# Patient Record
Sex: Female | Born: 1995 | Race: White | Hispanic: No | Marital: Single | Smoking: Never smoker
Health system: Southern US, Community
[De-identification: ages and names within clinical notes are randomized; demographics above are authoritative.]

## PROBLEM LIST (undated history)

## (undated) DIAGNOSIS — F909 Attention-deficit hyperactivity disorder, unspecified type: Secondary | ICD-10-CM

---

## 2018-09-01 ENCOUNTER — Encounter: Payer: Self-pay | Admitting: Emergency Medicine

## 2018-09-01 ENCOUNTER — Other Ambulatory Visit: Payer: Self-pay

## 2018-09-01 ENCOUNTER — Emergency Department
Admission: EM | Admit: 2018-09-01 | Discharge: 2018-09-01 | Disposition: A | Discharge status: Home / Self Care | Payer: 59 | Attending: Emergency Medicine | Admitting: Emergency Medicine

## 2018-09-01 ENCOUNTER — Emergency Department: Payer: 59

## 2018-09-01 DIAGNOSIS — Y929 Unspecified place or not applicable: Secondary | ICD-10-CM | POA: Insufficient documentation

## 2018-09-01 DIAGNOSIS — R52 Pain, unspecified: Secondary | ICD-10-CM

## 2018-09-01 DIAGNOSIS — Y939 Activity, unspecified: Secondary | ICD-10-CM | POA: Diagnosis not present

## 2018-09-01 DIAGNOSIS — F10929 Alcohol use, unspecified with intoxication, unspecified: Secondary | ICD-10-CM | POA: Insufficient documentation

## 2018-09-01 DIAGNOSIS — Y999 Unspecified external cause status: Secondary | ICD-10-CM | POA: Insufficient documentation

## 2018-09-01 DIAGNOSIS — Z79899 Other long term (current) drug therapy: Secondary | ICD-10-CM | POA: Diagnosis not present

## 2018-09-01 DIAGNOSIS — S76912A Strain of unspecified muscles, fascia and tendons at thigh level, left thigh, initial encounter: Secondary | ICD-10-CM | POA: Diagnosis not present

## 2018-09-01 DIAGNOSIS — X58XXXA Exposure to other specified factors, initial encounter: Secondary | ICD-10-CM | POA: Insufficient documentation

## 2018-09-01 DIAGNOSIS — M79605 Pain in left leg: Secondary | ICD-10-CM | POA: Diagnosis present

## 2018-09-01 DIAGNOSIS — T148XXA Other injury of unspecified body region, initial encounter: Secondary | ICD-10-CM

## 2018-09-01 HISTORY — DX: Attention-deficit hyperactivity disorder, unspecified type: F90.9

## 2018-09-01 MED ORDER — DICLOFENAC SODIUM 1 % TD GEL: 4.0000 g | Freq: Four times a day (QID) | TRANSDERMAL | 0 refills | Status: AC | PRN
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
## 2018-09-01 NOTE — Discharge Instructions (Signed)
It was a pleasure to take care of you today, and thank you for coming to our emergency department.  If you have any questions or concerns before leaving please ask the nurse to grab me and I'm more than happy to go through your aftercare instructions again.  If you have any concerns once you are home that you are not improving or are in fact getting worse before you can make it to your follow-up appointment, please do not hesitate to call [phone] and come back for further evaluation.  Merrily Brittle, MD  No results found for this or any previous visit. Dg Tibia/fibula Left  Result Date: 09/01/2018 CLINICAL DATA:  Pain and swelling throughout the left leg with bruising to the left shin area. Swelling in the left foot. All areas tender to palpation. EXAM: LEFT FEMUR 2 VIEWS; LEFT TIBIA AND FIBULA - 2 VIEW; LEFT ANKLE - 2 VIEW COMPARISON:  None. FINDINGS: Four views of the left femur, two views of the left ankle, and four views of the left tib-fib are obtained. No evidence of acute fracture or dislocation involving the left femur, left tibia, fibula, or the left ankle. Joint spaces are preserved in the left hip, left knee, and left ankle. No focal bone lesion or bone destruction is identified. Bone cortex appears intact. Soft tissues are unremarkable. IMPRESSION: No acute bony abnormalities. Electronically Signed   By: Burman Nieves M.D.   On: 09/01/2018 02:02   Dg Ankle 2 Views Left  Result Date: 09/01/2018 CLINICAL DATA:  Pain and swelling throughout the left leg with bruising to the left shin area. Swelling in the left foot. All areas tender to palpation. EXAM: LEFT FEMUR 2 VIEWS; LEFT TIBIA AND FIBULA - 2 VIEW; LEFT ANKLE - 2 VIEW COMPARISON:  None. FINDINGS: Four views of the left femur, two views of the left ankle, and four views of the left tib-fib are obtained. No evidence of acute fracture or dislocation involving the left femur, left tibia, fibula, or the left ankle. Joint spaces are preserved in  the left hip, left knee, and left ankle. No focal bone lesion or bone destruction is identified. Bone cortex appears intact. Soft tissues are unremarkable. IMPRESSION: No acute bony abnormalities. Electronically Signed   By: Burman Nieves M.D.   On: 09/01/2018 02:02   Dg Femur Min 2 Views Left  Result Date: 09/01/2018 CLINICAL DATA:  Pain and swelling throughout the left leg with bruising to the left shin area. Swelling in the left foot. All areas tender to palpation. EXAM: LEFT FEMUR 2 VIEWS; LEFT TIBIA AND FIBULA - 2 VIEW; LEFT ANKLE - 2 VIEW COMPARISON:  None. FINDINGS: Four views of the left femur, two views of the left ankle, and four views of the left tib-fib are obtained. No evidence of acute fracture or dislocation involving the left femur, left tibia, fibula, or the left ankle. Joint spaces are preserved in the left hip, left knee, and left ankle. No focal bone lesion or bone destruction is identified. Bone cortex appears intact. Soft tissues are unremarkable. IMPRESSION: No acute bony abnormalities. Electronically Signed   By: Burman Nieves M.D.   On: 09/01/2018 02:02
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
## 2019-11-29 IMAGING — CR DG TIBIA/FIBULA 2V*L*
1 series · 4 of 4 positions shown · non-contrast
Comparison: None.

CLINICAL DATA: Pain and swelling throughout the left leg with
bruising to the left shin area. Swelling in the left foot. All areas
tender to palpation.

EXAM:
LEFT FEMUR 2 VIEWS; LEFT TIBIA AND FIBULA - 2 VIEW; LEFT ANKLE - 2
VIEW

[Series 1: dg tibia/fibula left · 0.14mm/px · 4 of 4 slices shown]
[im 1/4]
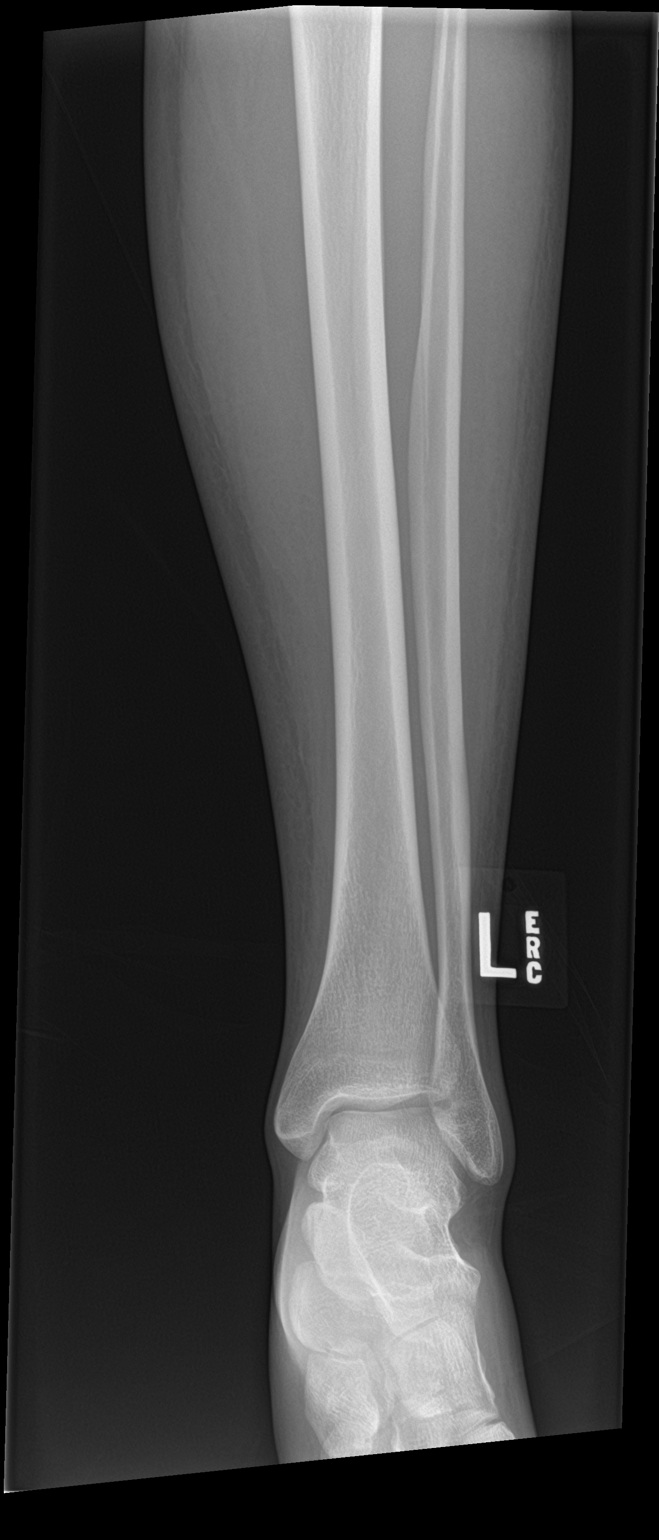
[im 2/4]
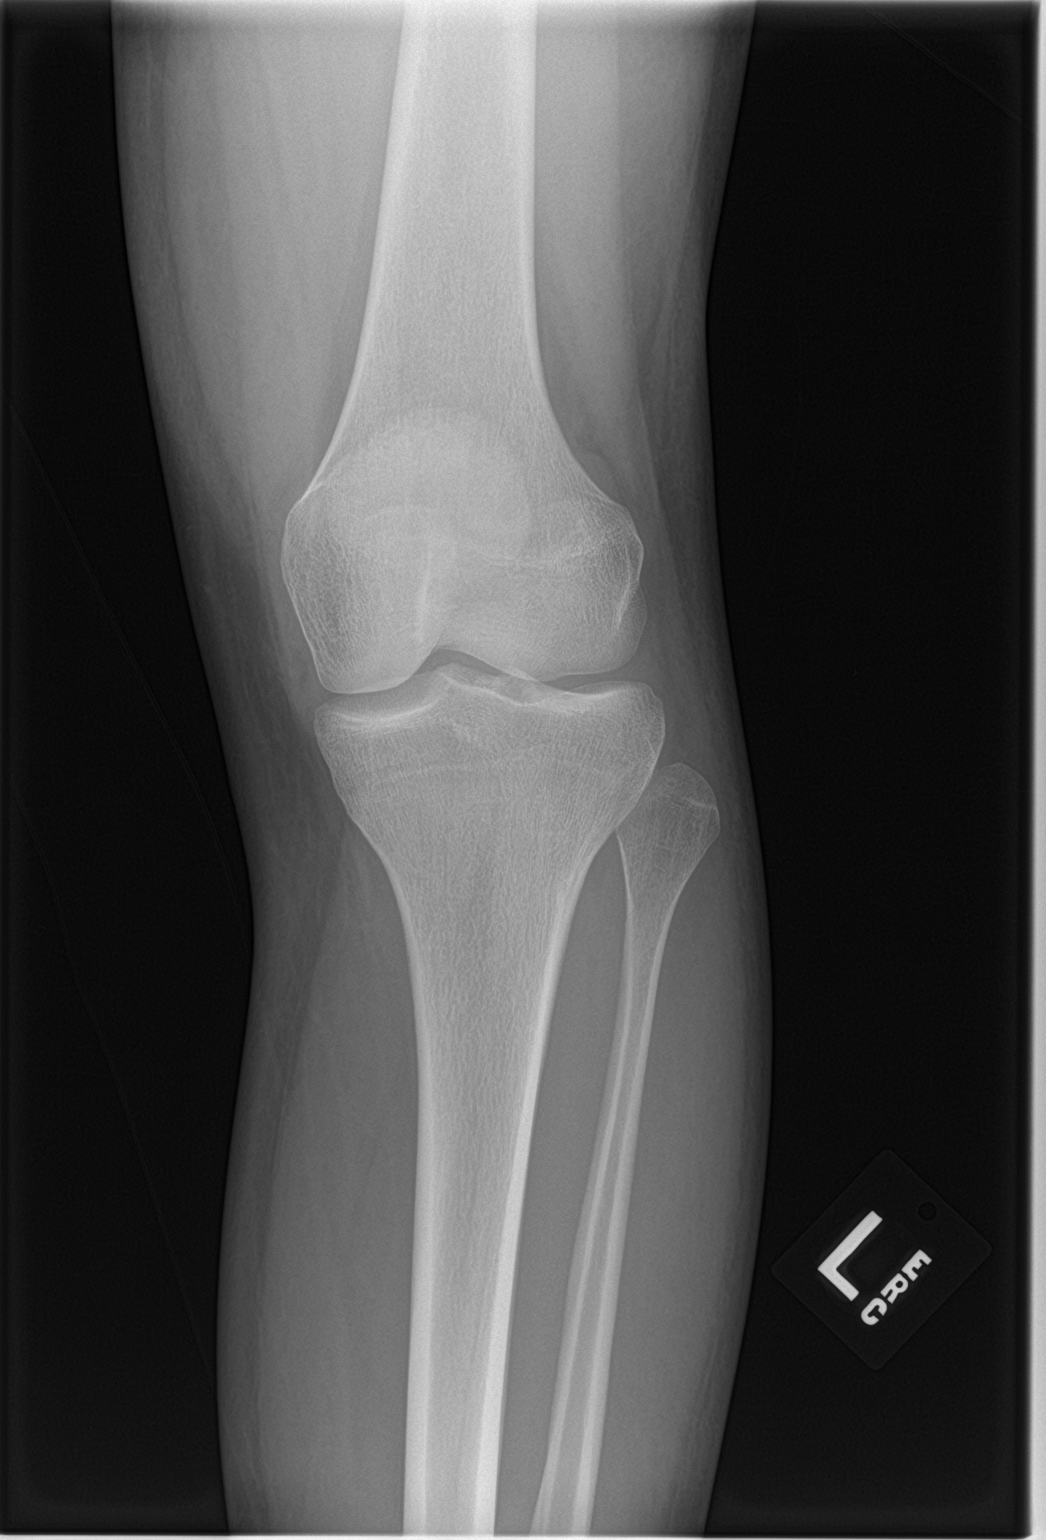
[im 3/4]
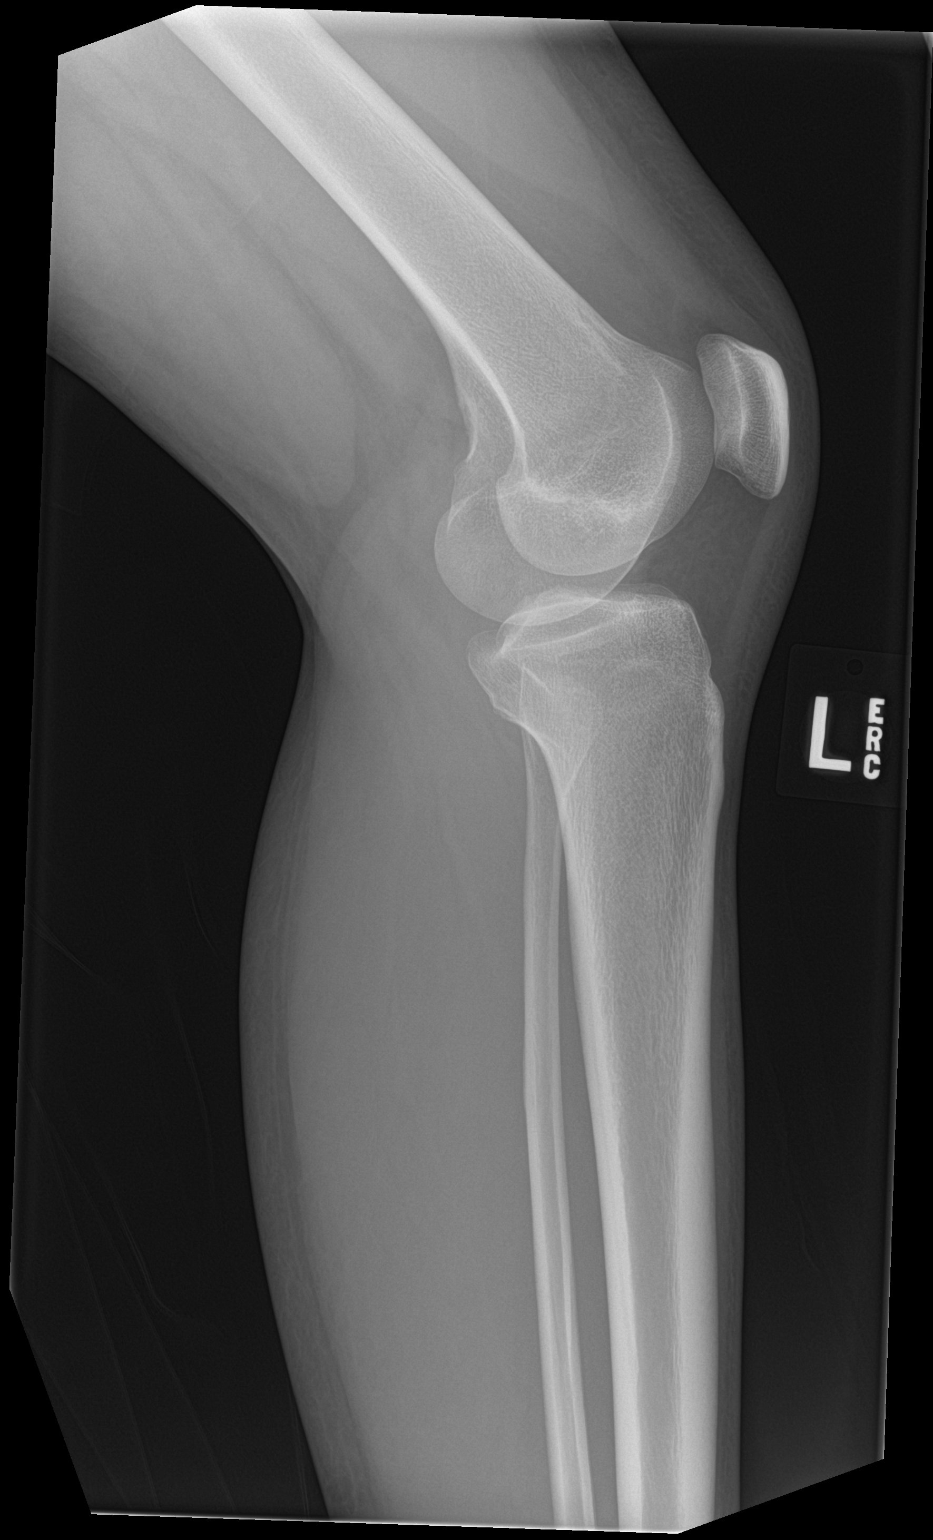
[im 4/4]
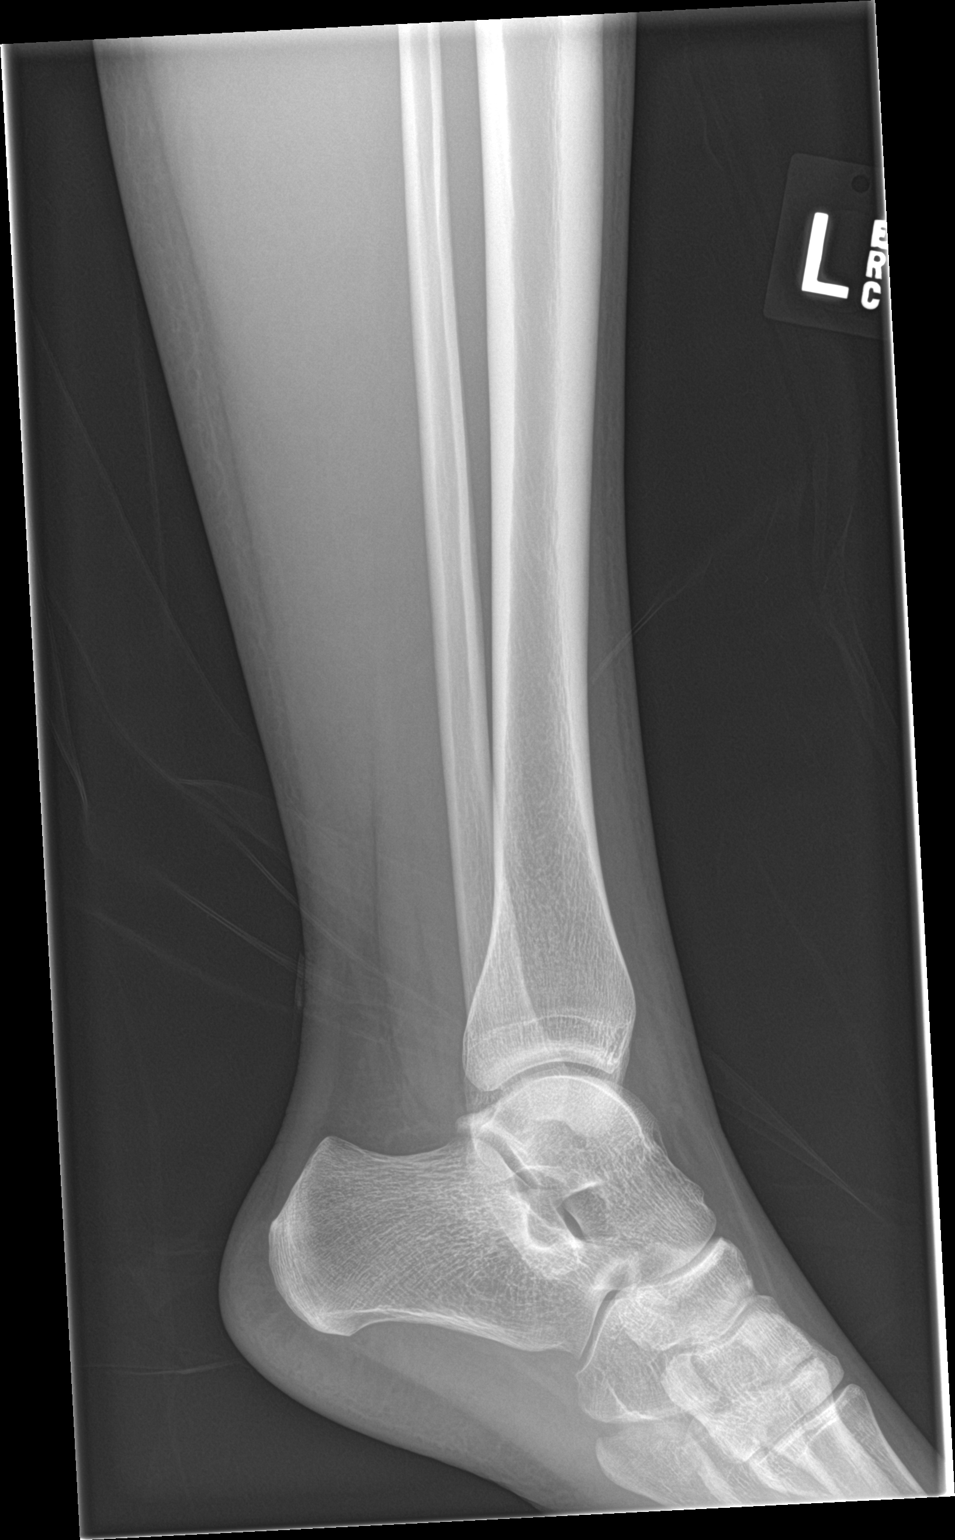

[4 of 4 positions shown; findings below may reference images not displayed]

FINDINGS: Four views of the left femur, two views of the left ankle, and four
views of the left tib-fib are obtained.

No evidence of acute fracture or dislocation involving the left
femur, left tibia, fibula, or the left ankle. Joint spaces are
preserved in the left hip, left knee, and left ankle. No focal bone
lesion or bone destruction is identified. Bone cortex appears
intact. Soft tissues are unremarkable.
IMPRESSION: No acute bony abnormalities.

## 2019-11-29 IMAGING — CR DG ANKLE 2V *L*
1 series · 2 of 2 positions shown · non-contrast
Comparison: None.

CLINICAL DATA: Pain and swelling throughout the left leg with
bruising to the left shin area. Swelling in the left foot. All areas
tender to palpation.

EXAM:
LEFT FEMUR 2 VIEWS; LEFT TIBIA AND FIBULA - 2 VIEW; LEFT ANKLE - 2
VIEW

[Series 1: dg ankle 2 views left · 0.14mm/px · 2 of 2 slices shown]
[im 1/2]
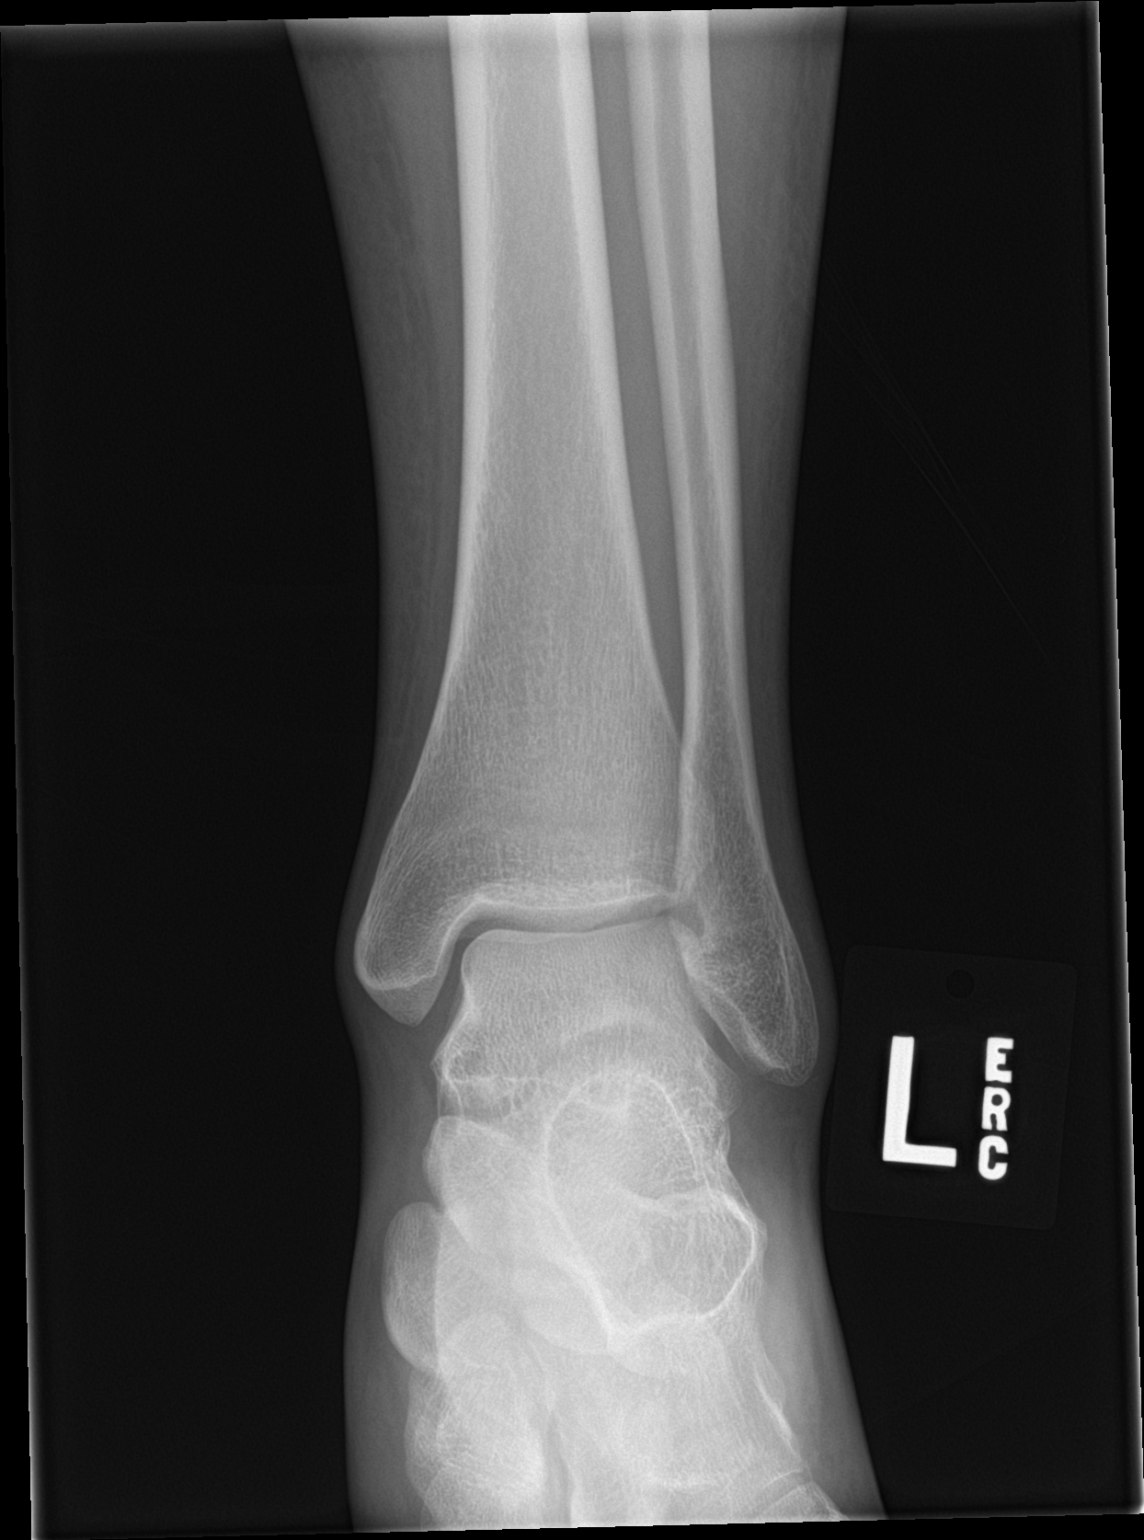
[im 2/2]
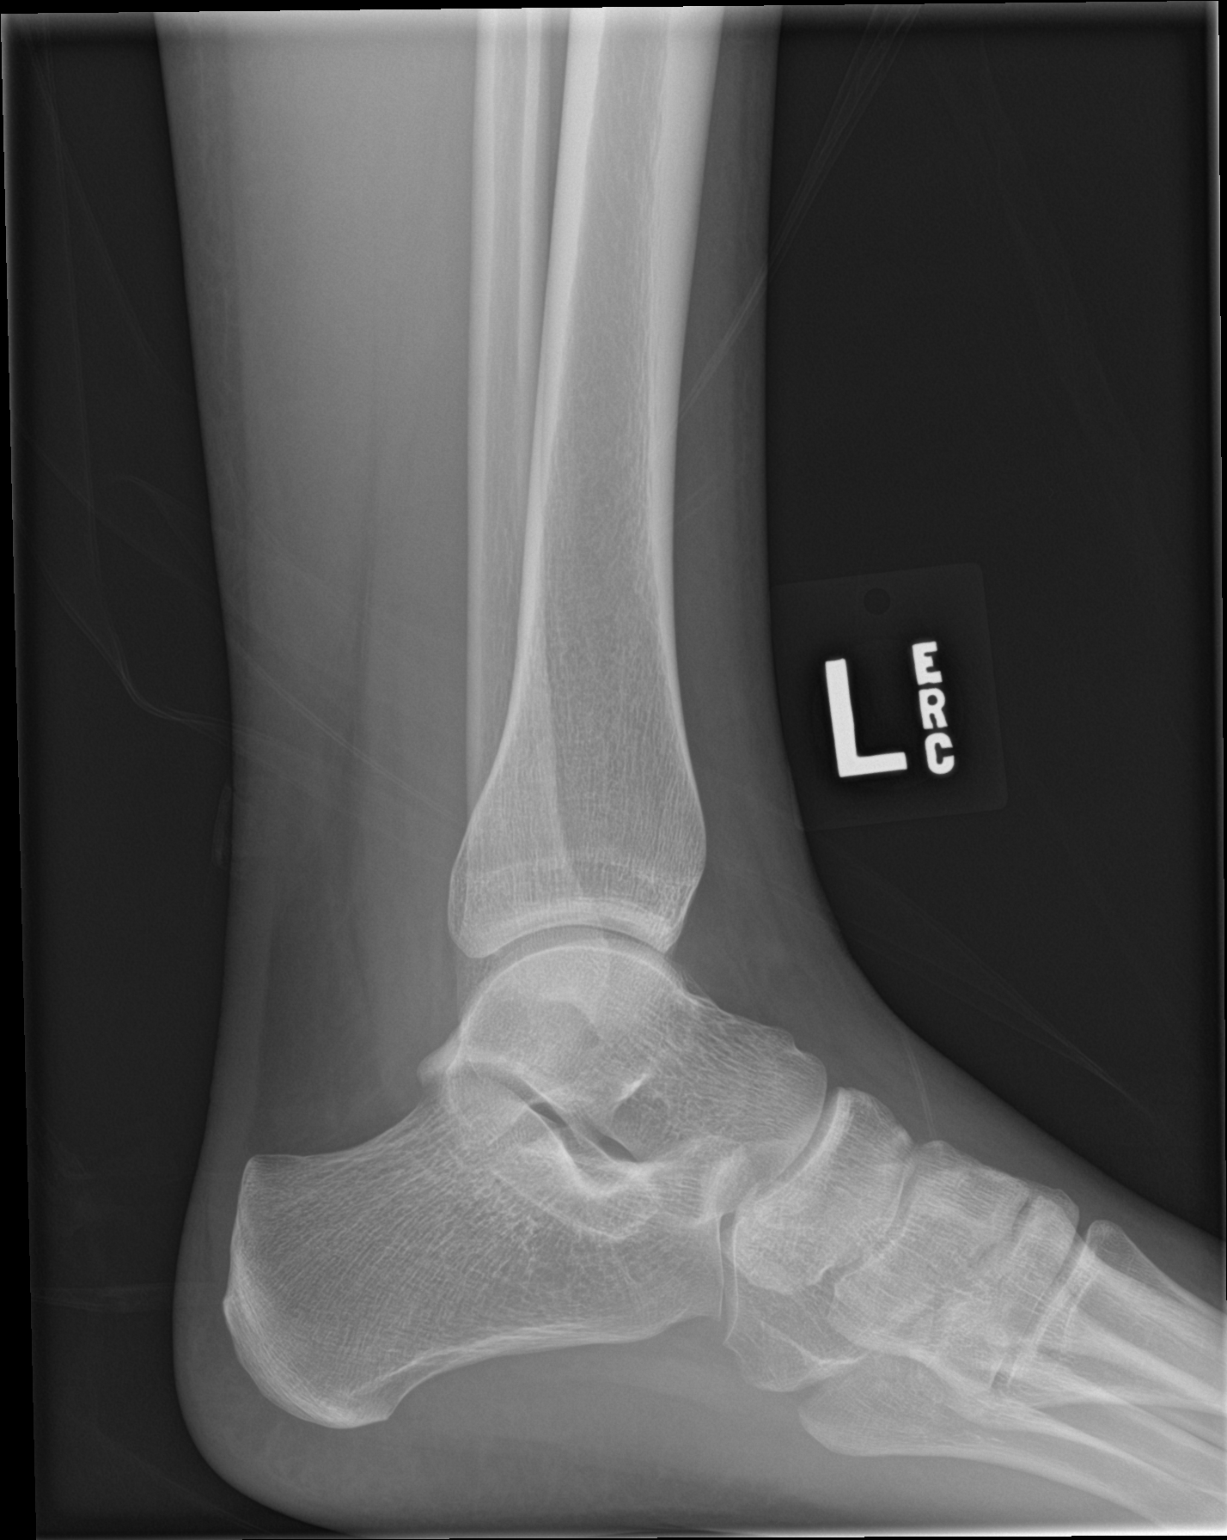

[2 of 2 positions shown; findings below may reference images not displayed]

FINDINGS: Four views of the left femur, two views of the left ankle, and four
views of the left tib-fib are obtained.

No evidence of acute fracture or dislocation involving the left
femur, left tibia, fibula, or the left ankle. Joint spaces are
preserved in the left hip, left knee, and left ankle. No focal bone
lesion or bone destruction is identified. Bone cortex appears
intact. Soft tissues are unremarkable.
IMPRESSION: No acute bony abnormalities.

## 2019-11-29 IMAGING — CR DG FEMUR 2+V*L*
1 series · 4 of 4 positions shown · non-contrast
Comparison: None.

CLINICAL DATA: Pain and swelling throughout the left leg with
bruising to the left shin area. Swelling in the left foot. All areas
tender to palpation.

EXAM:
LEFT FEMUR 2 VIEWS; LEFT TIBIA AND FIBULA - 2 VIEW; LEFT ANKLE - 2
VIEW

[Series 1: dg femur min 2 views left · 0.14mm/px · 4 of 4 slices shown]
[im 1/4]
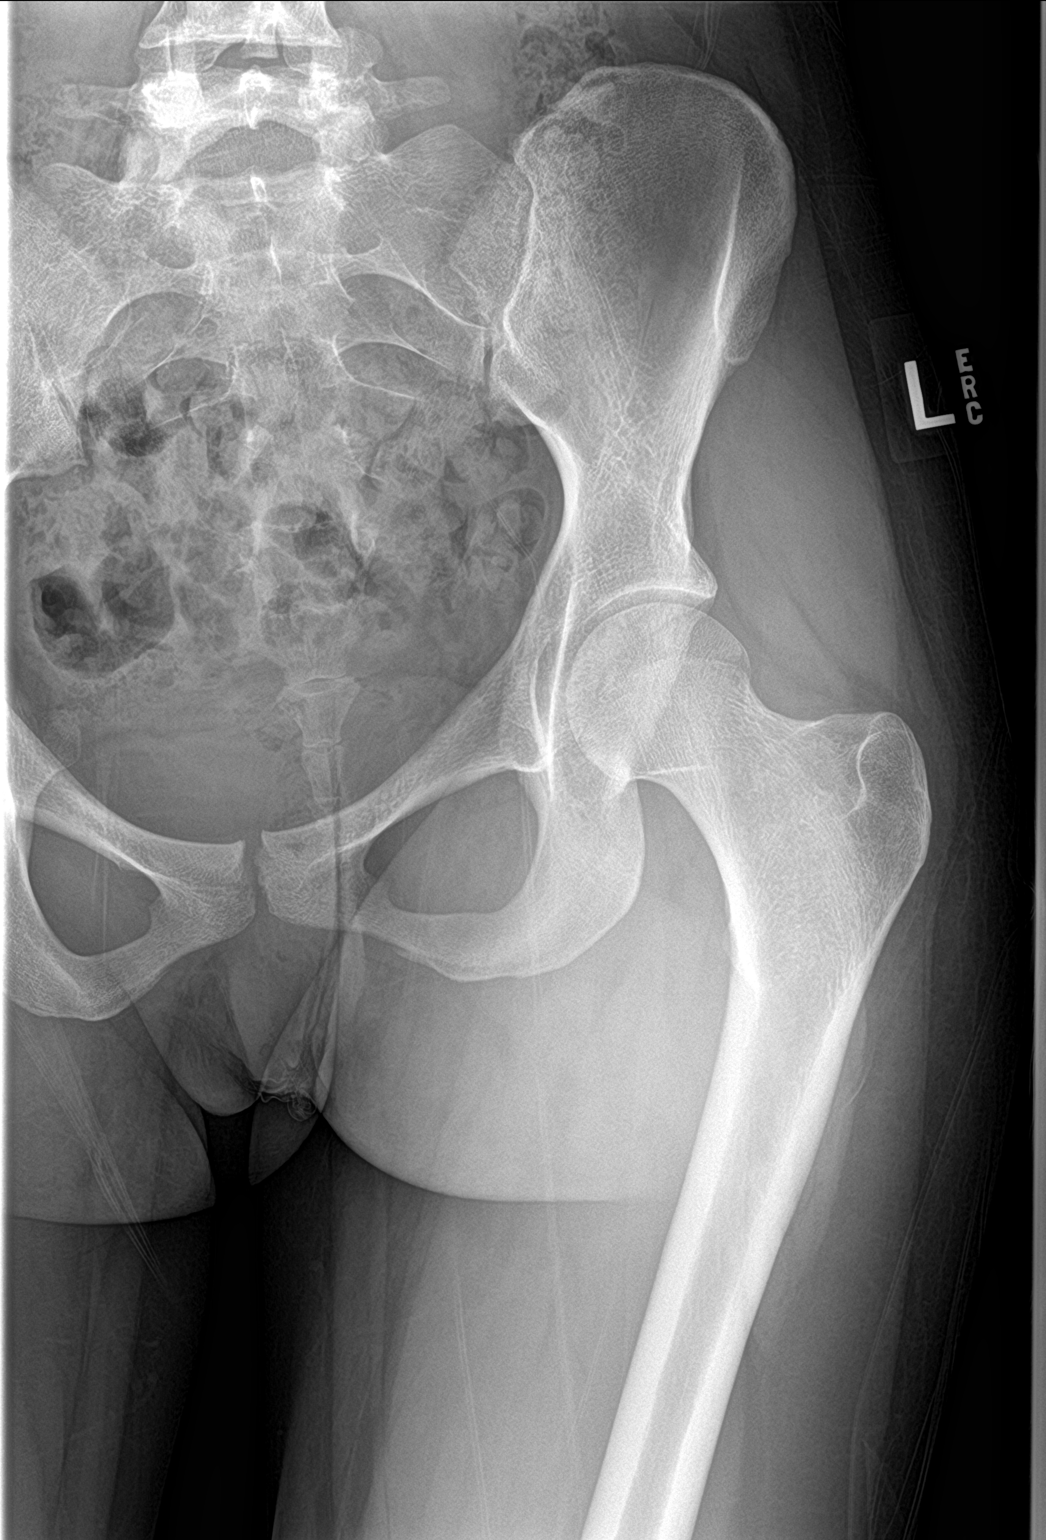
[im 2/4]
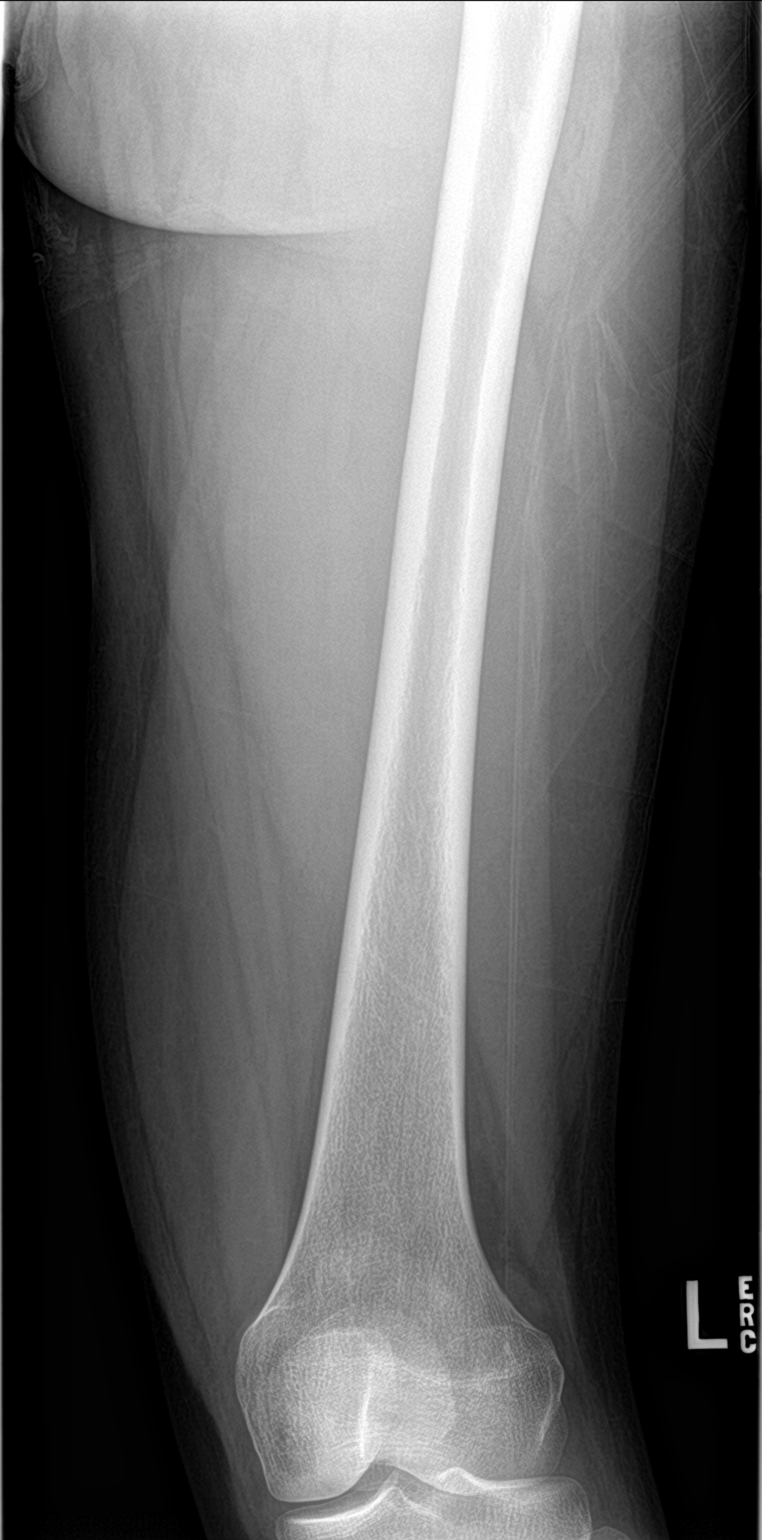
[im 3/4]
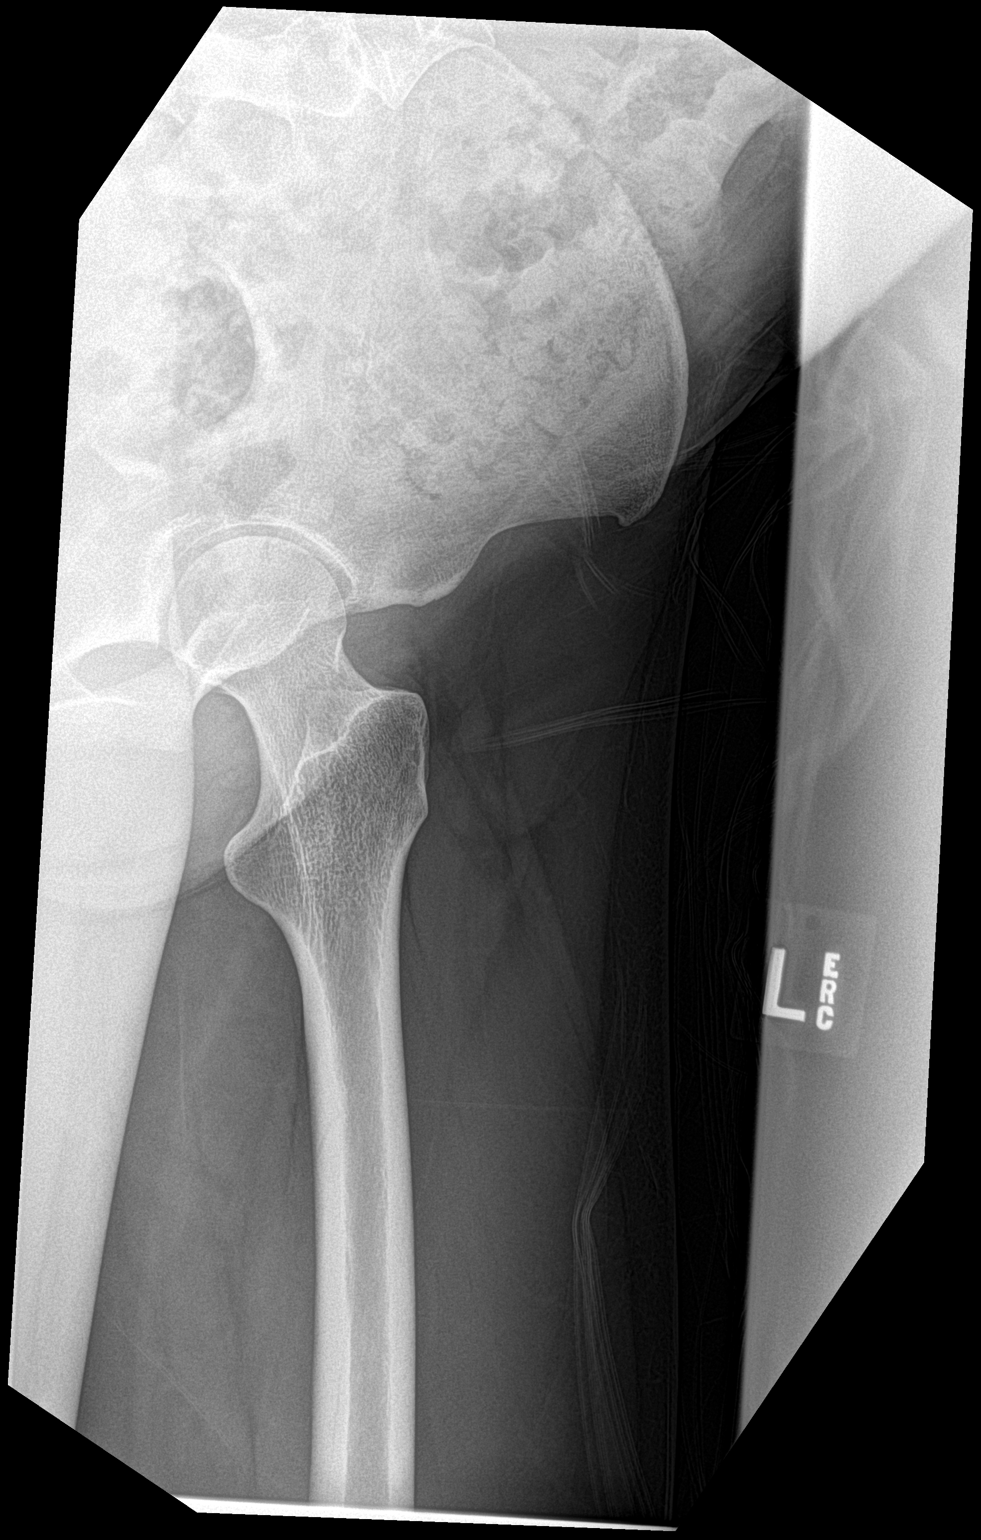
[im 4/4]
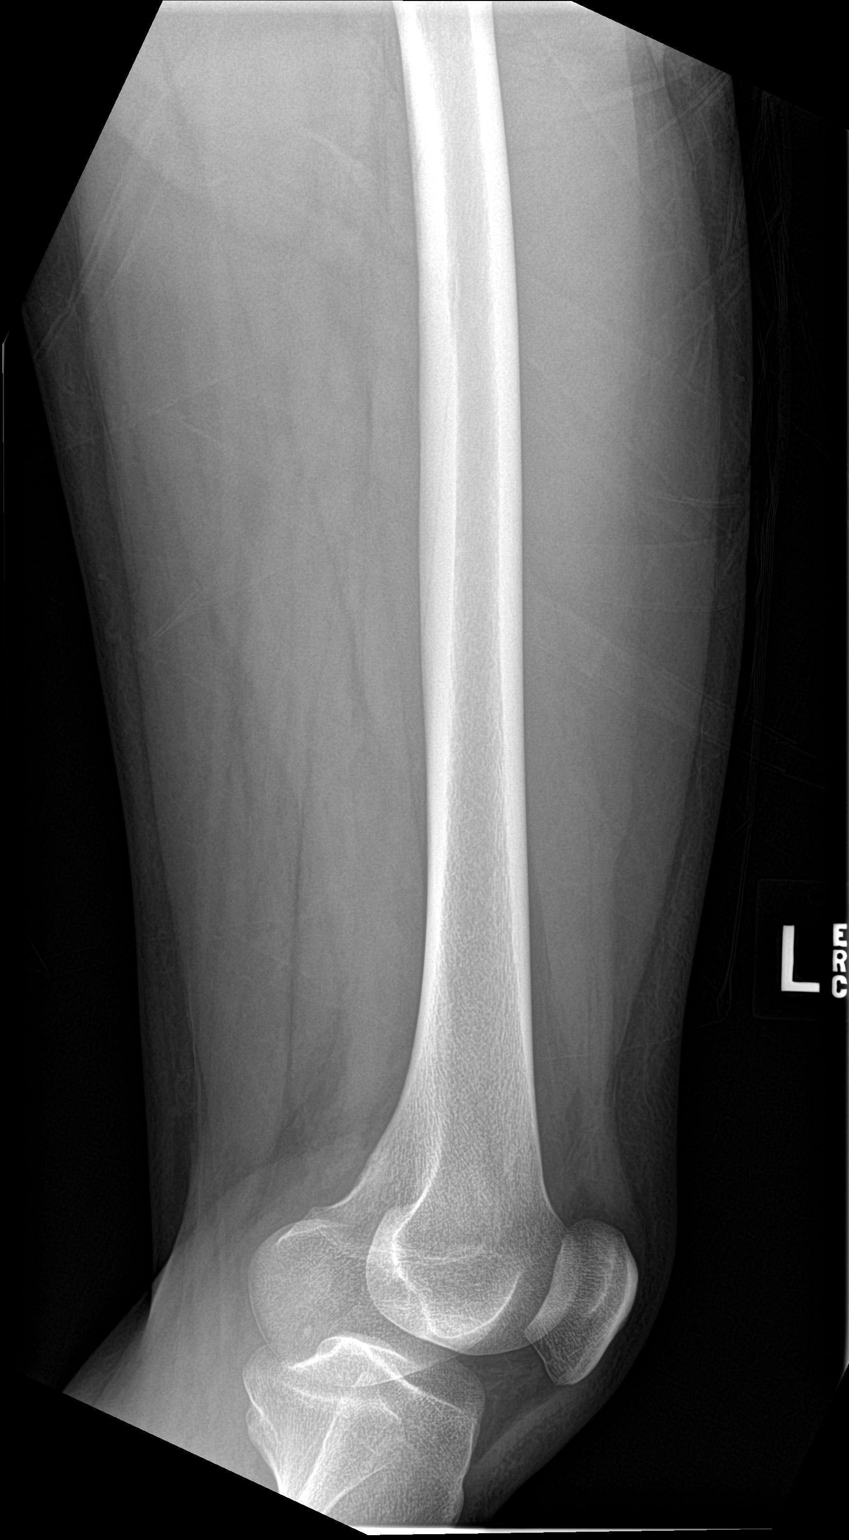

[4 of 4 positions shown; findings below may reference images not displayed]

FINDINGS: Four views of the left femur, two views of the left ankle, and four
views of the left tib-fib are obtained.

No evidence of acute fracture or dislocation involving the left
femur, left tibia, fibula, or the left ankle. Joint spaces are
preserved in the left hip, left knee, and left ankle. No focal bone
lesion or bone destruction is identified. Bone cortex appears
intact. Soft tissues are unremarkable.
IMPRESSION: No acute bony abnormalities.
